# Patient Record
Sex: Female | Born: 2000 | Race: White | Hispanic: No | Marital: Single
Health system: Southern US, Community
[De-identification: ages and names within clinical notes are randomized; demographics above are authoritative.]

---
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
## 2021-12-19 ENCOUNTER — Emergency Department
Admission: EM | Admit: 2021-12-19 | Discharge: 2021-12-19 | Disposition: A | Discharge status: Home / Self Care | Payer: BC Managed Care – PPO | Attending: Emergency Medicine | Admitting: Emergency Medicine

## 2021-12-19 ENCOUNTER — Encounter: Payer: Self-pay | Admitting: Emergency Medicine

## 2021-12-19 ENCOUNTER — Emergency Department: Payer: BC Managed Care – PPO

## 2021-12-19 ENCOUNTER — Other Ambulatory Visit: Payer: Self-pay

## 2021-12-19 DIAGNOSIS — S62641A Nondisplaced fracture of proximal phalanx of left index finger, initial encounter for closed fracture: Secondary | ICD-10-CM | POA: Insufficient documentation

## 2021-12-19 DIAGNOSIS — X58XXXA Exposure to other specified factors, initial encounter: Secondary | ICD-10-CM | POA: Insufficient documentation

## 2021-12-19 DIAGNOSIS — S6992XA Unspecified injury of left wrist, hand and finger(s), initial encounter: Secondary | ICD-10-CM | POA: Diagnosis present

## 2021-12-19 MED ORDER — OXYCODONE HCL 5 MG PO TABS: 5.0000 mg | ORAL_TABLET | Freq: Three times a day (TID) | ORAL | 0 refills | Status: AC | PRN

## 2021-12-19 NOTE — ED Triage Notes (Incomplete)
Pt reports was playing field hockey yesterday and jammed her left hand index finger. ?

## 2021-12-19 NOTE — ED Provider Notes (Signed)
? ?[facility] ?Provider Note ? ? ? Event Date/Time  ? First MD Initiated Contact with Patient 12/19/21 1743   ?  (approximate) ? ? ?History  ? ?Chief Complaint ?Finger Injury ? ? ?HPI ?Alicia Wilson is a 21 y.o. female, no remarkable medical history, presents to the emergency department for evaluation of finger injury.  Patient states that she was playing hockey when she accidentally injured her left index finger.  This event occurred yesterday.  Currently experiencing increased pain and swelling in the affected area.  She applied a splint to the finger, however feels that there is swelling just below her finger now as well.  Denies wrist pain, forearm pain, numbness/tingling, fever/chills, cold sensation, headache, neck pain, chest pain, shortness of breath.  ? ?History Limitations: No limitations. ? ?  ? ? ?Physical Exam  ?Triage Vital Signs: ?ED Triage Vitals  ?Enc Vitals Group  ?   BP 12/19/21 1727 115/83  ?   Pulse Rate 12/19/21 1727 81  ?   Resp 12/19/21 1727 20  ?   Temp 12/19/21 1727 98.5 ?F (36.9 ?C)  ?   Temp Source 12/19/21 1727 Oral  ?   SpO2 12/19/21 1727 98 %  ?   Weight 12/19/21 1658 136 lb 11 oz (62 kg)  ?   Height 12/19/21 1658 5\' 8"  (1.727 m)  ?   Head Circumference --   ?   Peak Flow --   ?   Pain Score 12/19/21 1658 10  ?   Pain Loc --   ?   Pain Edu? --   ?   Excl. in GC? --   ? ? ?Most recent vital signs: ?Vitals:  ? 12/19/21 1727  ?BP: 115/83  ?Pulse: 81  ?Resp: 20  ?Temp: 98.5 ?F (36.9 ?C)  ?SpO2: 98%  ? ? ?General: Awake, NAD.  ?Skin: Warm, dry.  ?CV: Good peripheral perfusion.  ?Resp: Normal effort.  ?Abd: Soft, non-tender. No distention.  ?Neuro: At baseline. No gross neurological deficits.  ?Other: Left index finger notably swollen.  Flexion and extension at the PIP/DIP joints maintained, though limited due to pain.  There is notable swelling along the proximal phalanx/MCP joint.  No surrounding warmth or erythema.  Cap refill intact.  No tenderness or swelling  along the other phalanges, metacarpals, or carpals.  No snuffbox tenderness.  No tenderness or swelling in the forearm or elbow. ? ?Physical Exam ? ? ? ?ED Results / Procedures / Treatments  ?Labs ?(all labs ordered are listed, but only abnormal results are displayed) ?Labs Reviewed - No data to display ? ? ?EKG ?Not applicable. ? ? ?RADIOLOGY ? ?ED Provider Interpretation: I personally reviewed and interpreted this x-ray, notable fracture along the proximal phalanx of the second digit. ? ?DG Hand Complete Left ? ?Result Date: 12/19/2021 ?CLINICAL DATA:  Left index finger pain after jamming her finger. EXAM: LEFT HAND - COMPLETE 3+ VIEW COMPARISON:  None. FINDINGS: There is an acute comminuted fracture of the proximal aspect of the second digit proximal phalanx which extends to the metacarpal phalangeal joint. No dislocation. There is no evidence of arthropathy or other focal bone abnormality. Soft tissues are unremarkable. IMPRESSION: Comminuted fracture of the second digit proximal phalanx which extends to the metacarpal phalangeal joint. Electronically Signed   By: 12/21/2021 M.D.   On: 12/19/2021 17:57   ? ?PROCEDURES: ? ?Critical Care performed: None. ? ?.Splint Application ? ?Date/Time: 12/19/2021 6:35 PM ?Performed by: 12/21/2021, PA ?Authorized  by: Varney Daily, PA  ? ?Consent:  ?  Consent obtained:  Verbal ?  Consent given by:  Patient ?  Risks, benefits, and alternatives were discussed: yes   ?  Risks discussed:  Discoloration, numbness, pain and swelling ?  Alternatives discussed:  No treatment ?Universal protocol:  ?  Patient identity confirmed:  Verbally with patient ?Pre-procedure details:  ?  Distal neurologic exam:  Normal ?  Distal perfusion: distal pulses strong   ?Procedure details:  ?  Location:  Finger ?  Finger location:  L index finger ?  Splint type:  Finger ?  Supplies:  Aluminum splint and elastic bandage ?Post-procedure details:  ?  Distal neurologic exam:  Normal ?   Distal perfusion: distal pulses strong   ?  Procedure completion:  Tolerated well, no immediate complications ? ? ? ?MEDICATIONS ORDERED IN ED: ?Medications - No data to display ? ? ?IMPRESSION / MDM / ASSESSMENT AND PLAN / ED COURSE  ?I reviewed the triage vital signs and the nursing notes. ?             ?               ? ? ?Differential diagnosis includes, but is not limited to, phalangeal fracture, mallet finger, [place] finger, finger dislocation, metacarpal fracture. ? ?ED Course ?Patient appears well.  Vital signs within normal limits.  NAD. ? ?Finger splint applied with buddy taping to adjacent middle finger.  Patient tolerated the procedure well.  See details above. ? ?Assessment/Plan ?Patient presents with finger injury, found to have a proximal phalangeal fracture extending into the MCP joint.  Applied finger splint and reinforced with elastic bandage with buddy taping.  Patient tolerated the procedure well.  We will discharge this patient with short-term supply of oxycodone, as well as a referral to orthopedics.  Encouraged her to maintain the splint for 4 to 6 weeks unless directed otherwise by orthopedics.  Patient expressed understanding and agreed with the plan. ? ?Patient was provided with anticipatory guidance, return precautions, and educational material. Encouraged the patient to return to the emergency department at any time if they begin to experience any new or worsening symptoms.  ? ?  ? ? ?FINAL CLINICAL IMPRESSION(S) / ED DIAGNOSES  ? ?Final diagnoses:  ?Closed nondisplaced fracture of proximal phalanx of left index finger, initial encounter  ? ? ? ?Rx / DC Orders  ? ?ED Discharge Orders   ? ?      Ordered  ?  oxyCODONE (ROXICODONE) 5 MG immediate release tablet  Every 8 hours PRN       ? 12/19/21 1830  ? ?  ?  ? ?  ? ? ? ?Note:  This document was prepared using Dragon voice recognition software and may include unintentional dictation errors. ?  ?Varney Daily, [place] ?12/19/21 1837 ? ?   ?Minna Antis, MD ?12/19/21 2317 ? ?

## 2021-12-19 NOTE — Discharge Instructions (Addendum)
-  Take Tylenol/ibuprofen as needed for pain.  Utilize oxycodone sparingly. ?-Keep the splint on for at least 4 to 6 weeks, unless directed otherwise by orthopedics. ?-Schedule an appointment with the orthopedist listed above, as discussed. ?-Return to the emergency department anytime if you begin to experience any new or worsening symptoms. ?

## 2023-04-22 IMAGING — CR DG HAND COMPLETE 3+V*L*
3 series · 3 of 3 positions shown · non-contrast
Comparison: None.

CLINICAL DATA: Left index finger pain after jamming her finger.

EXAM:
LEFT HAND - COMPLETE 3+ VIEW

[hand ap]
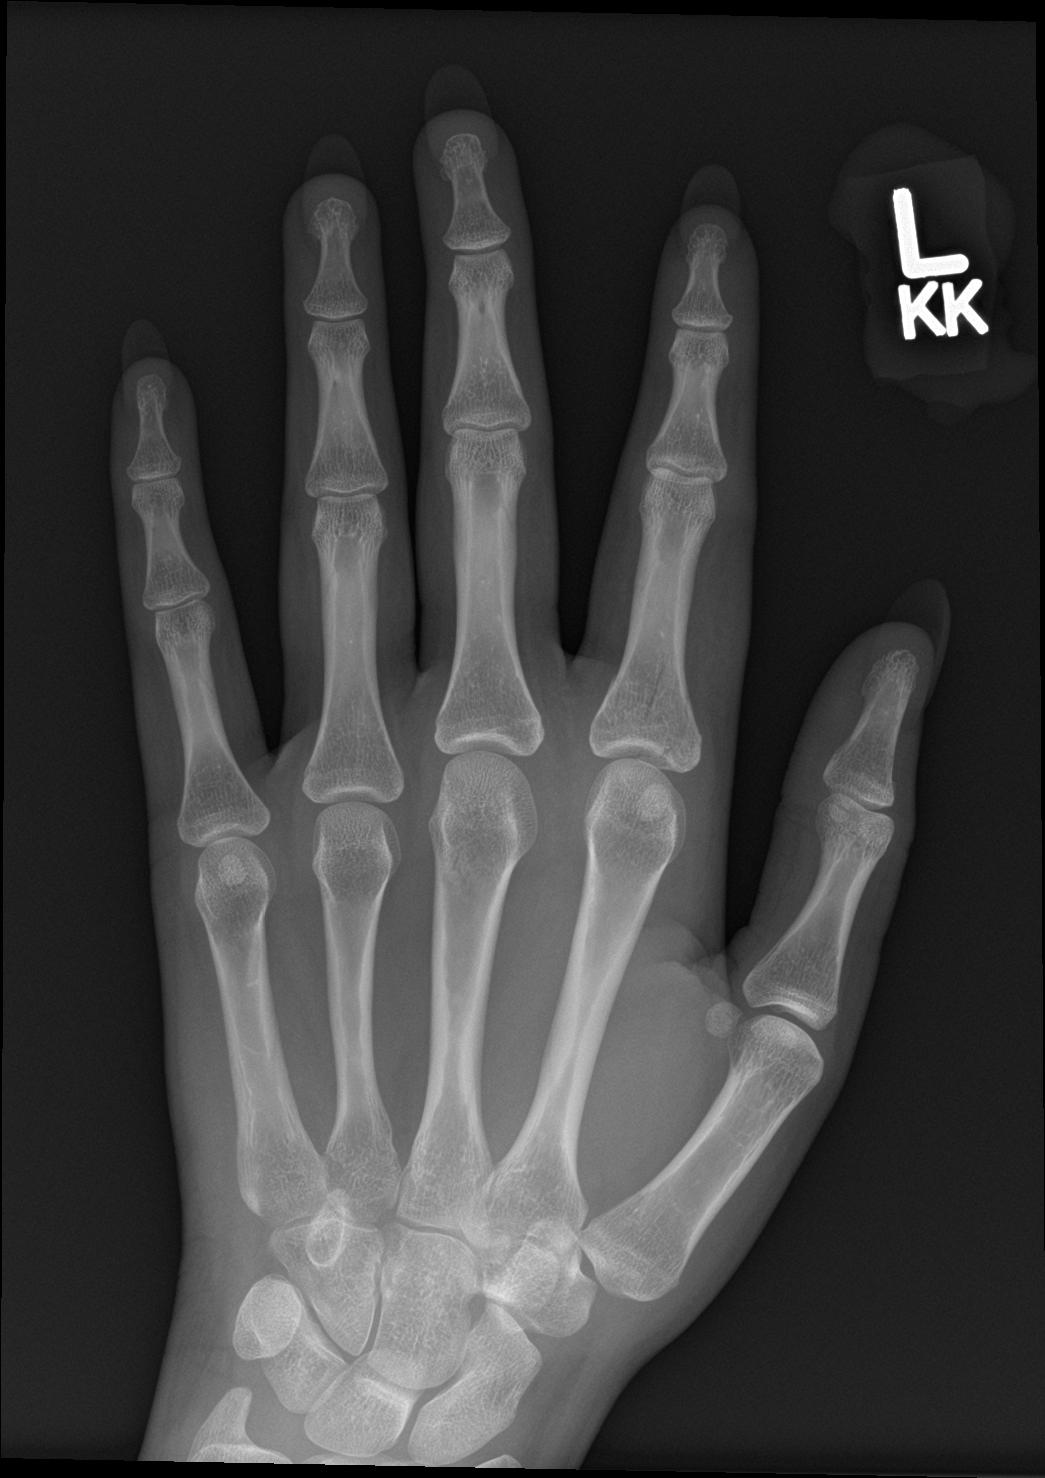

[hand obl]
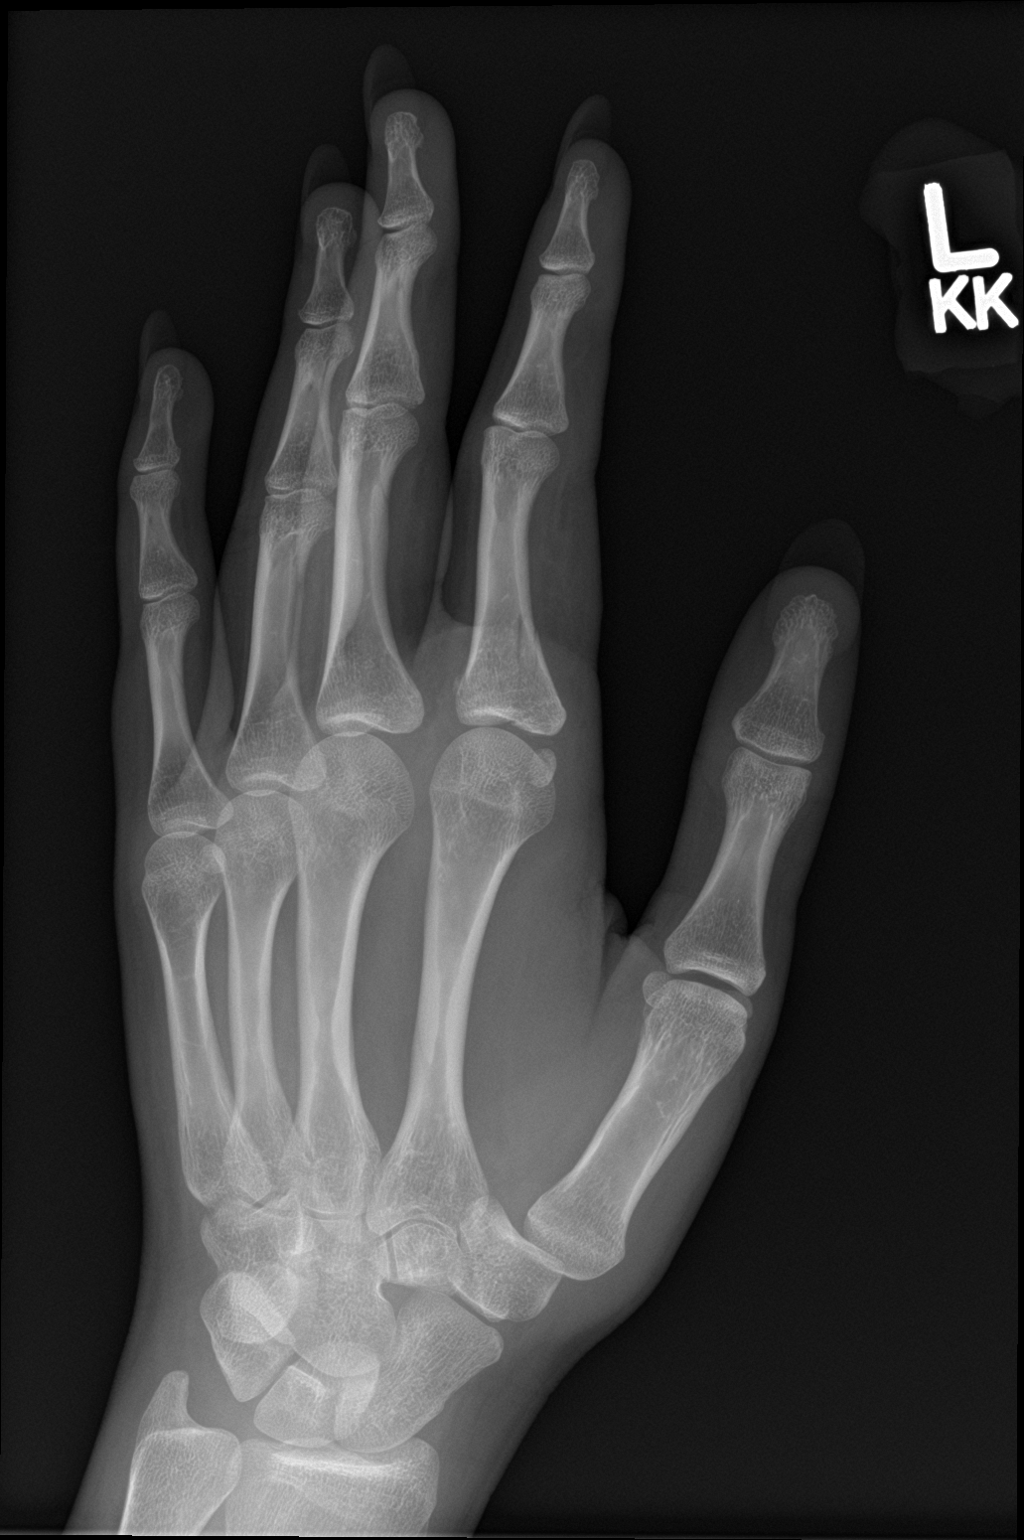

[hand lat]
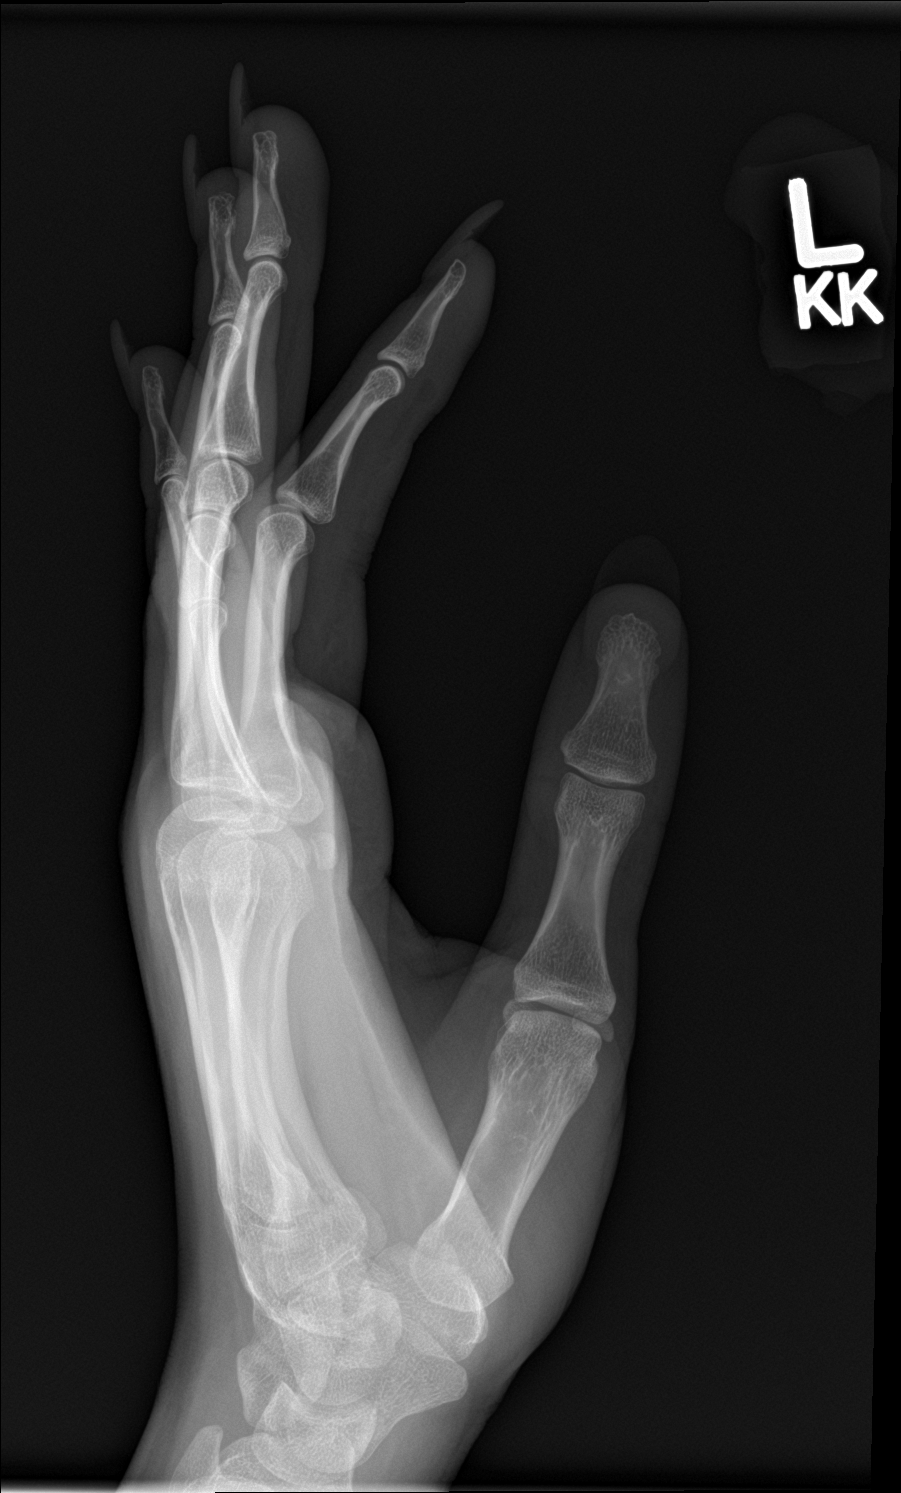

[3 of 3 positions shown; findings below may reference images not displayed]

FINDINGS: There is an acute comminuted fracture of the proximal aspect of the
second digit proximal phalanx which extends to the metacarpal
phalangeal joint. No dislocation. There is no evidence of
arthropathy or other focal bone abnormality. Soft tissues are
unremarkable.
IMPRESSION: Comminuted fracture of the second digit proximal phalanx which
extends to the metacarpal phalangeal joint.
